# Patient Record
Sex: Female | Born: 1970 | Race: White | Hispanic: No
Health system: Southern US, Community
[De-identification: ages and names within clinical notes are randomized; demographics above are authoritative.]

## PROBLEM LIST (undated history)

## (undated) DIAGNOSIS — I10 Essential (primary) hypertension: Secondary | ICD-10-CM

## (undated) DIAGNOSIS — J45909 Unspecified asthma, uncomplicated: Secondary | ICD-10-CM

## (undated) DIAGNOSIS — M1990 Unspecified osteoarthritis, unspecified site: Secondary | ICD-10-CM

## (undated) DIAGNOSIS — T4145XA Adverse effect of unspecified anesthetic, initial encounter: Secondary | ICD-10-CM

## (undated) DIAGNOSIS — T8859XA Other complications of anesthesia, initial encounter: Secondary | ICD-10-CM

## (undated) HISTORY — PX: CHOLECYSTECTOMY: SHX55

---

## 2006-02-20 HISTORY — PX: WRIST FRACTURE SURGERY: SHX121

## 2006-03-03 ENCOUNTER — Emergency Department: Payer: Self-pay | Admitting: Emergency Medicine

## 2006-03-05 ENCOUNTER — Ambulatory Visit: Payer: Self-pay | Admitting: Orthopaedic Surgery

## 2012-01-06 ENCOUNTER — Ambulatory Visit: Payer: Self-pay | Admitting: Internal Medicine

## 2012-01-09 ENCOUNTER — Ambulatory Visit: Payer: Self-pay | Admitting: Family Medicine

## 2012-03-19 ENCOUNTER — Ambulatory Visit: Payer: Self-pay | Admitting: Orthopedic Surgery

## 2012-03-23 HISTORY — PX: KNEE ARTHROSCOPY: SUR90

## 2012-03-26 ENCOUNTER — Ambulatory Visit: Payer: Self-pay | Admitting: Orthopedic Surgery

## 2013-12-31 ENCOUNTER — Emergency Department: Payer: Self-pay | Admitting: Emergency Medicine

## 2014-01-13 ENCOUNTER — Emergency Department: Payer: Self-pay | Admitting: Emergency Medicine

## 2014-06-18 ENCOUNTER — Emergency Department: Payer: Self-pay | Admitting: Emergency Medicine

## 2015-03-03 ENCOUNTER — Ambulatory Visit: Payer: Self-pay | Admitting: Orthopedic Surgery

## 2015-04-16 NOTE — Op Note (Signed)
PATIENT NAME:  Sharon Tucker, Sharon Tucker MR#:  [id number] DATE OF BIRTH:  1971-11-14  DATE OF PROCEDURE:  03/26/2012  PREOPERATIVE DIAGNOSIS: Right knee internal derangement and osteoarthritis.   POSTOPERATIVE DIAGNOSES: Right knee osteoarthritis with anterolateral and posteromedial and anteromedial meniscus tears.   PROCEDURE: Right knee partial medial and lateral meniscectomy.   SURGEON: Laurene Footman, MD  ANESTHESIA: General.    DESCRIPTION OF PROCEDURE: Patient brought to the Operating Room and after adequate anesthesia was obtained, the right leg was prepped and draped in the usual sterile fashion with an arthroscopic legholder and tourniquet applied. After patient identification and timeout procedures were completed, an inferolateral portal was made and the arthroscope was introduced. Initial inspection revealed significant patellofemoral degenerative change but normal tracking. There was significant articular cartilage loss in the central portion of the patella and areas of nearly complete loss in the trochlea. Coming around medially, an inferomedial portal was made and on probing there were some areas of fissuring of the articular cartilage on both femoral and tibial condyles. On probing there was a small tear at the junction of the posterior and middle thirds of the medial meniscus and this was debrided with the use of a meniscal punch and ArthroCare wand getting back to a stable margin. Anteriorly there was also a tear of the anterior horn, approximately 3 to 4 mm in thickness from the inner edge and about 1 cm in length. This was ablated with the use of the ArthroCare wand as well. The anterior cruciate ligament was intact and the notch appeared normal. Going laterally there was much less degenerative change present, however, on probing there was an anterior horn tear that extended towards the notch and this was 2 to 3 mm in thickness and about 1 to 1.5 cm in length involving most of the anterior  horn. The ArthroCare wand was used to ablate this as well. Following addressing the meniscal pathology the gutters were checked. There were no loose bodies with no other treatable problems present. The knee was thoroughly irrigated and instrumentation withdrawn. The wounds were closed with simple interrupted 4-0 nylon. 20 mL of 0.5% Sensorcaine with epinephrine was infiltrated into the portals. Xeroform, 4 x 4's, Webril, and Ace wrap applied and the patient sent to recovery in stable condition.   ESTIMATED BLOOD LOSS: Minimal.   COMPLICATIONS: None.   SPECIMENS: None.  ____________________________ Laurene Footman, MD mjm:cms D: 03/26/2012 20:01:15 ET T: 03/27/2012 08:23:20 ET JOB#: [id number]  cc: Laurene Footman, MD, <Dictator> Laurene Footman MD ELECTRONICALLY SIGNED 03/27/2012 16:00

## 2015-06-13 ENCOUNTER — Encounter
Admission: RE | Admit: 2015-06-13 | Discharge: 2015-06-13 | Disposition: A | Discharge status: Home / Self Care | Payer: BLUE CROSS/BLUE SHIELD | Source: Ambulatory Visit | Attending: Orthopedic Surgery | Admitting: Orthopedic Surgery

## 2015-06-13 DIAGNOSIS — Z01810 Encounter for preprocedural cardiovascular examination: Secondary | ICD-10-CM | POA: Insufficient documentation

## 2015-06-13 DIAGNOSIS — Z87898 Personal history of other specified conditions: Secondary | ICD-10-CM | POA: Insufficient documentation

## 2015-06-13 DIAGNOSIS — I10 Essential (primary) hypertension: Secondary | ICD-10-CM | POA: Diagnosis not present

## 2015-06-13 DIAGNOSIS — Z01812 Encounter for preprocedural laboratory examination: Secondary | ICD-10-CM | POA: Diagnosis present

## 2015-06-13 DIAGNOSIS — Z9049 Acquired absence of other specified parts of digestive tract: Secondary | ICD-10-CM | POA: Insufficient documentation

## 2015-06-13 DIAGNOSIS — J45909 Unspecified asthma, uncomplicated: Secondary | ICD-10-CM | POA: Insufficient documentation

## 2015-06-13 HISTORY — DX: Adverse effect of unspecified anesthetic, initial encounter: T41.45XA

## 2015-06-13 HISTORY — DX: Essential (primary) hypertension: I10

## 2015-06-13 HISTORY — DX: Other complications of anesthesia, initial encounter: T88.59XA

## 2015-06-13 HISTORY — DX: Unspecified asthma, uncomplicated: J45.909

## 2015-06-13 LAB — CBC
HCT: 42.8 % (ref 35.0–47.0)
Hemoglobin: 14.1 g/dL (ref 12.0–16.0)
MCH: 30.8 pg (ref 26.0–34.0)
MCHC: 33 g/dL (ref 32.0–36.0)
MCV: 93.4 fL (ref 80.0–100.0)
PLATELETS: 234 10*3/uL (ref 150–440)
RBC: 4.58 MIL/uL (ref 3.80–5.20)
RDW: 13.4 % (ref 11.5–14.5)
WBC: 7.8 10*3/uL (ref 3.6–11.0)

## 2015-06-13 LAB — URINALYSIS COMPLETE WITH MICROSCOPIC (ARMC ONLY)
Bilirubin Urine: NEGATIVE
Glucose, UA: NEGATIVE mg/dL
KETONES UR: NEGATIVE mg/dL
Leukocytes, UA: NEGATIVE
Nitrite: NEGATIVE
PH: 5 (ref 5.0–8.0)
PROTEIN: NEGATIVE mg/dL
SPECIFIC GRAVITY, URINE: 1.025 (ref 1.005–1.030)

## 2015-06-13 LAB — BASIC METABOLIC PANEL
ANION GAP: 5 (ref 5–15)
BUN: 15 mg/dL (ref 6–20)
CHLORIDE: 105 mmol/L (ref 101–111)
CO2: 31 mmol/L (ref 22–32)
CREATININE: 0.65 mg/dL (ref 0.44–1.00)
Calcium: 9.3 mg/dL (ref 8.9–10.3)
GFR calc Af Amer: >60 mL/min (ref >60)
GFR calc non Af Amer: >60 mL/min (ref >60)
Glucose, Bld: 95 mg/dL (ref 65–99)
Potassium: 3.4 mmol/L — ABNORMAL LOW (ref 3.5–5.1)
Sodium: 141 mmol/L (ref 135–145)

## 2015-06-13 LAB — PROTIME-INR
INR: 0.94
Prothrombin Time: 12.8 seconds (ref 11.4–15.0)

## 2015-06-13 LAB — APTT: aPTT: 28 seconds (ref 24–36)

## 2015-06-13 NOTE — Patient Instructions (Addendum)
  Your procedure is scheduled on: Thursday June 22, 2015. Report to Same Day Surgery. To find out your arrival time please call [phone] between 1PM - 3PM on Wednesday June 21, 2015.  Remember: Instructions that are not followed completely may result in serious medical risk, up to and including death, or upon the discretion of your surgeon and anesthesiologist your surgery may need to be rescheduled.    _x___ 1. Do not eat food or drink liquids after midnight. No gum chewing or hard candies.     ____ 2. No Alcohol for 24 hours before or after surgery.   ____ 3. Bring all medications with you on the day of surgery if instructed.    _x__ 4. Notify your doctor if there is any change in your medical condition     (cold, fever, infections).     Do not wear jewelry, make-up, hairpins, clips or nail polish.  Do not wear lotions, powders, or perfumes. You may wear deodorant.  Do not shave 48 hours prior to surgery. Men may shave face and neck.  Do not bring valuables to the hospital.    [facility] is not responsible for any belongings or valuables.               Contacts, dentures or bridgework may not be worn into surgery.  Leave your suitcase in the car. After surgery it may be brought to your room.  For patients admitted to the hospital, discharge time is determined by your treatment team.   Patients discharged the day of surgery will not be allowed to drive home.    Please read over the following fact sheets that you were given:   [facility] Preparing for Surgery  ____ Take these medicines the morning of surgery with A SIP OF WATER:    1. Inhaler and bring to hospital  ____ Fleet Enema (as directed)   __x__ Use CHG Soap as directed  __x__ Use inhalers on the day of surgery  ____ Stop metformin 2 days prior to surgery    ____ Take 1/2 of usual insulin dose the night before surgery and none on the morning of surgery.   ____ Stop Coumadin/Plavix/aspirin on Does not  Apply.  _x___ Stop Anti-inflammatories on today.   ____ Stop supplements until after surgery.    __x__ Bring C-Pap to the hospital.

## 2015-06-22 ENCOUNTER — Encounter
Admission: RE | Disposition: A | Discharge status: Home / Self Care | Payer: Self-pay | Source: Ambulatory Visit | Attending: Orthopedic Surgery

## 2015-06-22 ENCOUNTER — Ambulatory Visit: Payer: BLUE CROSS/BLUE SHIELD | Admitting: Anesthesiology

## 2015-06-22 ENCOUNTER — Ambulatory Visit
Admission: RE | Admit: 2015-06-22 | Discharge: 2015-06-22 | Disposition: A | Discharge status: Home / Self Care | Payer: BLUE CROSS/BLUE SHIELD | Source: Ambulatory Visit | Attending: Orthopedic Surgery | Admitting: Orthopedic Surgery

## 2015-06-22 ENCOUNTER — Encounter: Payer: Self-pay | Admitting: *Deleted

## 2015-06-22 DIAGNOSIS — J45909 Unspecified asthma, uncomplicated: Secondary | ICD-10-CM | POA: Diagnosis not present

## 2015-06-22 DIAGNOSIS — Z79899 Other long term (current) drug therapy: Secondary | ICD-10-CM | POA: Insufficient documentation

## 2015-06-22 DIAGNOSIS — S83242A Other tear of medial meniscus, current injury, left knee, initial encounter: Secondary | ICD-10-CM | POA: Diagnosis present

## 2015-06-22 DIAGNOSIS — Z9049 Acquired absence of other specified parts of digestive tract: Secondary | ICD-10-CM | POA: Insufficient documentation

## 2015-06-22 DIAGNOSIS — I10 Essential (primary) hypertension: Secondary | ICD-10-CM | POA: Insufficient documentation

## 2015-06-22 DIAGNOSIS — M23222 Derangement of posterior horn of medial meniscus due to old tear or injury, left knee: Secondary | ICD-10-CM | POA: Diagnosis not present

## 2015-06-22 DIAGNOSIS — Z9102 Food additives allergy status: Secondary | ICD-10-CM | POA: Diagnosis not present

## 2015-06-22 DIAGNOSIS — Z87891 Personal history of nicotine dependence: Secondary | ICD-10-CM | POA: Insufficient documentation

## 2015-06-22 DIAGNOSIS — Z7951 Long term (current) use of inhaled steroids: Secondary | ICD-10-CM | POA: Insufficient documentation

## 2015-06-22 DIAGNOSIS — Z886 Allergy status to analgesic agent status: Secondary | ICD-10-CM | POA: Diagnosis not present

## 2015-06-22 DIAGNOSIS — M94262 Chondromalacia, left knee: Secondary | ICD-10-CM | POA: Diagnosis not present

## 2015-06-22 HISTORY — PX: KNEE ARTHROSCOPY: SHX127

## 2015-06-22 LAB — POCT PREGNANCY, URINE: Preg Test, Ur: NEGATIVE

## 2015-06-22 SURGERY — ARTHROSCOPY, KNEE
Anesthesia: General | Laterality: Left

## 2015-06-22 MED ORDER — PROMETHAZINE HCL 12.5 MG PO TABS
12.5000 mg | ORAL_TABLET | ORAL | Status: DC | PRN
Start: 2015-06-22 — End: 2017-07-15

## 2015-06-22 MED ORDER — LACTATED RINGERS IV SOLN
INTRAVENOUS | Status: DC
  Administered 2015-06-22 (×2): via INTRAVENOUS

## 2015-06-22 MED ORDER — EPHEDRINE SULFATE 50 MG/ML IJ SOLN
INTRAMUSCULAR | Status: DC | PRN
  Administered 2015-06-22: 5 mg via INTRAVENOUS

## 2015-06-22 MED ORDER — GLYCOPYRROLATE 0.2 MG/ML IJ SOLN
INTRAMUSCULAR | Status: DC | PRN
  Administered 2015-06-22: 0.2 mg via INTRAVENOUS

## 2015-06-22 MED ORDER — PROPOFOL 10 MG/ML IV BOLUS
INTRAVENOUS | Status: DC | PRN
  Administered 2015-06-22: 150 mg via INTRAVENOUS

## 2015-06-22 MED ORDER — HYDROCODONE-ACETAMINOPHEN 5-325 MG PO TABS: 1.0000 | ORAL_TABLET | ORAL | Status: DC | PRN

## 2015-06-22 MED ORDER — FENTANYL CITRATE (PF) 100 MCG/2ML IJ SOLN
INTRAMUSCULAR | Status: AC
  Administered 2015-06-22: 25 ug via INTRAVENOUS
  Filled 2015-06-22: qty 2

## 2015-06-22 MED ORDER — FENTANYL CITRATE (PF) 100 MCG/2ML IJ SOLN
25.0000 ug | INTRAMUSCULAR | Status: AC | PRN
  Administered 2015-06-22 (×6): 25 ug via INTRAVENOUS

## 2015-06-22 MED ORDER — MIDAZOLAM HCL 2 MG/2ML IJ SOLN
INTRAMUSCULAR | Status: DC | PRN
  Administered 2015-06-22 (×2): 1 mg via INTRAVENOUS

## 2015-06-22 MED ORDER — FAMOTIDINE 20 MG PO TABS
ORAL_TABLET | ORAL | Status: AC
  Administered 2015-06-22: 20 mg via ORAL
  Filled 2015-06-22: qty 1

## 2015-06-22 MED ORDER — ONDANSETRON HCL 4 MG/2ML IJ SOLN
INTRAMUSCULAR | Status: DC | PRN
  Administered 2015-06-22: 4 mg via INTRAVENOUS

## 2015-06-22 MED ORDER — BUPIVACAINE-EPINEPHRINE (PF) 0.25% -1:200000 IJ SOLN
INTRAMUSCULAR | Status: AC
  Filled 2015-06-22: qty 30

## 2015-06-22 MED ORDER — LIDOCAINE HCL (PF) 1 % IJ SOLN
INTRAMUSCULAR | Status: AC
  Filled 2015-06-22: qty 30

## 2015-06-22 MED ORDER — LIDOCAINE HCL 1 % IJ SOLN
INTRAMUSCULAR | Status: DC | PRN
  Administered 2015-06-22: 2 mL

## 2015-06-22 MED ORDER — CEFAZOLIN SODIUM-DEXTROSE 2-3 GM-% IV SOLR
INTRAVENOUS | Status: AC
  Filled 2015-06-22: qty 50

## 2015-06-22 MED ORDER — LIDOCAINE HCL (CARDIAC) 20 MG/ML IV SOLN
INTRAVENOUS | Status: DC | PRN
  Administered 2015-06-22: 30 mg via INTRAVENOUS

## 2015-06-22 MED ORDER — BUPIVACAINE-EPINEPHRINE 0.25% -1:200000 IJ SOLN
INTRAMUSCULAR | Status: DC | PRN
  Administered 2015-06-22: 20 mL

## 2015-06-22 MED ORDER — FENTANYL CITRATE (PF) 100 MCG/2ML IJ SOLN
INTRAMUSCULAR | Status: DC | PRN
  Administered 2015-06-22 (×2): 50 ug via INTRAVENOUS

## 2015-06-22 MED ORDER — FAMOTIDINE 20 MG PO TABS
20.0000 mg | ORAL_TABLET | Freq: Once | ORAL | Status: AC
  Administered 2015-06-22: 20 mg via ORAL

## 2015-06-22 MED ORDER — ONDANSETRON HCL 4 MG/2ML IJ SOLN: 4.0000 mg | Freq: Once | INTRAMUSCULAR | Status: DC | PRN

## 2015-06-22 MED ORDER — CEFAZOLIN SODIUM-DEXTROSE 2-3 GM-% IV SOLR
2.0000 g | Freq: Once | INTRAVENOUS | Status: AC
  Administered 2015-06-22: 2 g via INTRAVENOUS

## 2015-06-22 SURGICAL SUPPLY — 34 items
BUR RADIUS 3.5 (BURR) ×3 IMPLANT
BUR RADIUS 4.0X18.5 (BURR) ×3 IMPLANT
CLOSURE WOUND 1/2 X4 (GAUZE/BANDAGES/DRESSINGS) ×1
COOLER POLAR GLACIER W/PUMP (MISCELLANEOUS) ×3 IMPLANT
DECANTER SPIKE VIAL GLASS SM (MISCELLANEOUS) ×6 IMPLANT
DRAPE IMP U-DRAPE 54X76 (DRAPES) ×3 IMPLANT
DURAPREP 26ML APPLICATOR (WOUND CARE) ×9 IMPLANT
GAUZE PETRO XEROFOAM 1X8 (MISCELLANEOUS) ×3 IMPLANT
GAUZE SPONGE 4X4 12PLY STRL (GAUZE/BANDAGES/DRESSINGS) ×3 IMPLANT
GLOVE BIOGEL PI IND STRL 9 (GLOVE) ×1 IMPLANT
GLOVE BIOGEL PI INDICATOR 9 (GLOVE) ×2
GLOVE SURG 9.0 ORTHO LTXF (GLOVE) ×3 IMPLANT
GOWN STRL REUS W/ TWL LRG LVL3 (GOWN DISPOSABLE) ×1 IMPLANT
GOWN STRL REUS W/TWL 2XL LVL3 (GOWN DISPOSABLE) ×3 IMPLANT
GOWN STRL REUS W/TWL LRG LVL3 (GOWN DISPOSABLE) ×2
IV LACTATED RINGER IRRG 3000ML (IV SOLUTION) ×12
IV LR IRRIG 3000ML ARTHROMATIC (IV SOLUTION) ×6 IMPLANT
KIT RM TURNOVER STRD PROC AR (KITS) ×3 IMPLANT
MANIFOLD NEPTUNE II (INSTRUMENTS) ×3 IMPLANT
PACK ARTHROSCOPY KNEE (MISCELLANEOUS) ×3 IMPLANT
PAD ABD DERMACEA PRESS 5X9 (GAUZE/BANDAGES/DRESSINGS) IMPLANT
PAD WRAPON POLAR KNEE (MISCELLANEOUS) ×1 IMPLANT
SET TUBE SUCT SHAVER OUTFL 24K (TUBING) ×3 IMPLANT
SET TUBE TIP INTRA-ARTICULAR (MISCELLANEOUS) ×3 IMPLANT
SOL PREP PVP 2OZ (MISCELLANEOUS) ×3
SOLUTION PREP PVP 2OZ (MISCELLANEOUS) ×1 IMPLANT
STRIP CLOSURE SKIN 1/2X4 (GAUZE/BANDAGES/DRESSINGS) ×2 IMPLANT
SUT ETHILON 4-0 (SUTURE) ×2
SUT ETHILON 4-0 FS2 18XMFL BLK (SUTURE) ×1
SUTURE ETHLN 4-0 FS2 18XMF BLK (SUTURE) ×1 IMPLANT
TUBING ARTHRO INFLOW-ONLY STRL (TUBING) ×3 IMPLANT
WAND HAND CNTRL MULTIVAC 50 (MISCELLANEOUS) IMPLANT
WAND HAND CNTRL MULTIVAC 90 (MISCELLANEOUS) ×3 IMPLANT
WRAPON POLAR PAD KNEE (MISCELLANEOUS) ×3

## 2015-06-22 NOTE — Transfer of Care (Signed)
Immediate Anesthesia Transfer of Care Note  Patient: Sharon Tucker  Procedure(s) Performed: Procedure(s): ARTHROSCOPY KNEE/MEDIAL MENISCECTOMY (Left)  Patient Location: PACU  Anesthesia Type:General  Level of Consciousness: awake, oriented and patient cooperative  Airway & Oxygen Therapy: Patient Spontanous Breathing and Patient connected to face mask oxygen  Post-op Assessment: Report given to RN, Post -op Vital signs reviewed and stable and Patient moving all extremities X 4  Post vital signs: Reviewed and stable  Last Vitals:  Filed Vitals:   06/22/15 0926  BP: 157/103  Pulse: 96  Temp: 36.2 C  Resp: 14    Complications: No apparent anesthesia complications

## 2015-06-22 NOTE — Op Note (Signed)
05/11/2015  2:42 PM  PATIENT:  Sharon Tucker  PRE-OPERATIVE DIAGNOSIS:  TEAR OF MEDIAL MENISCUS  POST-OPERATIVE DIAGNOSIS:  Same  PROCEDURE:  LEFT KNEE ARTHROSCOPY WITH  Partial MEDIAL MENISECTOMY, partial synovectomy and chondroplasty of the medial femoral condyle and patellofemoral joint  SURGEON:  Thornton Park, MD  ANESTHESIA:   General  PREOPERATIVE INDICATIONS:  Sharon Tucker  44 y.o. female with a diagnosis of TEAR OF LEFT MEDIAL MENISCUS who failed conservative management and elected for surgical management.    The risks benefits and alternatives were discussed with the patient preoperatively including the risks of infection, bleeding, nerve injury, knee stiffness, persistent pain, osteoarthritis and the need for further surgery. Medical  risks include DVT and pulmonary embolism, myocardial infarction, stroke, pneumonia, respiratory failure and death. The patient understood these risks and wished to proceed.   OPERATIVE FINDINGS: Torn medial meniscus involving the posterior horn, grade 3 lesion of medial femoral condyle proximally 5 x 5 mm, grade 2 chondral wear of the undersurface of patella with extensive grade 3 chondral wear of the femoral trochlea  OPERATIVE PROCEDURE: Patient was met in the preoperative area. The operative extremity was signed with the word yes and my initials according the hospital's correct site of surgery protocol.  The patient was brought to the operating room where they was placed supine on the operative table. General anesthesia was administered. The patient was prepped and draped in a sterile fashion.  A timeout was performed to verify the patient's name, date of birth, medical record number, correct site of surgery correct procedure to be performed. It was also used to verify the patient had had received antibiotics that all appropriate instruments, and radiographic studies were available in the room. Once all in attendance were in  agreement, the case began.  Proposed arthroscopy incisions were drawn out with a surgical marker. These were pre-injected with 1% lidocaine plain. An 11 blade was used to establish an inferior lateral and inferomedial portals. The inferomedial portal was created using a 18-gauge spinal needle under direct visualization.  A full diagnostic examination of the knee was performed including the suprapatellar pouch, patellofemoral joint, medial lateral compartments as well as the medial lateral gutters, the intercondylar notch in the posterior knee.  Patient had the meniscal tear treated with a 4-0 resector shaver blade and straight duckbill biter. The meniscus was debrided until a stable rim was achieved. A chondroplasty of the medial femoral condyle, trochlea and undersurface of patella was also performed using a 4-0 resector shaver blade. A partial synovectomy was also performed using a 4-0 resector shaver blade and 90 ArthroCare wand.  The knee was then copiously lavaged. All arthroscopic incisions removed. The 2 arthroscopy portals were closed with 4-0 nylon. Steri-Strips were applied along with a dry sterile and compressive dressing. The patient was brought to the PACU in stable condition. I scrubbed and present the entire case and all sharp and instrument counts were correct at the conclusion the case. I spoke to the patient's family postoperatively to let them know the case it done without complication and the patient was stable in the recovery room.  Timoteo Gaul, MD

## 2015-06-22 NOTE — Anesthesia Preprocedure Evaluation (Signed)
Anesthesia Evaluation  Patient identified by MRN, date of birth, ID band Patient awake    Reviewed: Allergy & Precautions, NPO status , Patient's Chart, lab work & pertinent test results, reviewed documented beta blocker date and time   History of Anesthesia Complications (+) history of anesthetic complications  Airway Mallampati: III  TM Distance: >3 FB     Dental  (+) Chipped   Pulmonary asthma , former smoker,          Cardiovascular hypertension,     Neuro/Psych    GI/Hepatic   Endo/Other    Renal/GU      Musculoskeletal   Abdominal   Peds  Hematology   Anesthesia Other Findings Obesity.  Reproductive/Obstetrics                             Anesthesia Physical Anesthesia Plan  ASA: II  Anesthesia Plan: General   Post-op Pain Management:    Induction: Intravenous  Airway Management Planned: LMA  Additional Equipment:   Intra-op Plan:   Post-operative Plan:   Informed Consent: I have reviewed the patients History and Physical, chart, labs and discussed the procedure including the risks, benefits and alternatives for the proposed anesthesia with the patient or authorized representative who has indicated his/her understanding and acceptance.     Plan Discussed with: CRNA  Anesthesia Plan Comments:         Anesthesia Quick Evaluation

## 2015-06-22 NOTE — Anesthesia Procedure Notes (Signed)
Procedure Name: LMA Insertion Date/Time: 06/22/2015 7:46 AM Performed by: Courtney Paris Pre-anesthesia Checklist: Patient identified, Emergency Drugs available, Suction available, Patient being monitored and Timeout performed Patient Re-evaluated:Patient Re-evaluated prior to inductionOxygen Delivery Method: Circle system utilized Preoxygenation: Pre-oxygenation with 100% oxygen Intubation Type: Combination inhalational/ intravenous induction Ventilation: Mask ventilation without difficulty LMA: LMA inserted LMA Size: 4.0 Grade View: Grade II Number of attempts: 1 Tube secured with: Tape Dental Injury: Teeth and Oropharynx as per pre-operative assessment

## 2015-06-22 NOTE — Anesthesia Postprocedure Evaluation (Signed)
  Anesthesia Post-op Note  Patient: Sharon Tucker  Procedure(s) Performed: Procedure(s): ARTHROSCOPY KNEE/MEDIAL MENISCECTOMY (Left)  Anesthesia type:General  Patient location: PACU  Post pain: Pain level controlled  Post assessment: Post-op Vital signs reviewed, Patient's Cardiovascular Status Stable, Respiratory Function Stable, Patent Airway and No signs of Nausea or vomiting  Post vital signs: Reviewed and stable  Last Vitals:  Filed Vitals:   06/22/15 1055  BP: 128/78  Pulse: 70  Temp:   Resp: 16    Level of consciousness: awake, alert  and patient cooperative  Complications: No apparent anesthesia complications

## 2015-06-22 NOTE — Discharge Instructions (Signed)
AMBULATORY SURGERY  °DISCHARGE INSTRUCTIONS ° ° °1) The drugs that you were given will stay in your system until tomorrow so for the next 24 hours you should not: ° °A) Drive an automobile °B) Make any legal decisions °C) Drink any alcoholic beverage ° ° °2) You may resume regular meals tomorrow.  Today it is better to start with liquids and gradually work up to solid foods. ° °You may eat anything you prefer, but it is better to start with liquids, then soup and crackers, and gradually work up to solid foods. ° ° °3) Please notify your doctor immediately if you have any unusual bleeding, trouble breathing, redness and pain at the surgery site, drainage, fever, or pain not relieved by medication. °Please call to schedule your post-operative visit. ° °

## 2015-06-22 NOTE — H&P (Signed)
PREOPERATIVE H&P  Chief Complaint: TEAR OF MEDIAL MENISCUS  HPI: Sharon Tucker is a 44 y.o. female who presents for preoperative history and physical with a diagnosis of TEAR OF LEFT MEDIAL MENISCUS. Symptoms include pain and mechanical symptoms which are significantly impairing activities of daily living. She has failed conservative management She has elected for surgical treatment.   Past Medical History  Diagnosis Date  . Complication of anesthesia     Hard time waking up from anesthesia  . Asthma   . Hypertension    Past Surgical History  Procedure Laterality Date  . Knee arthroscopy Right 03/2012    3 times  . Cholecystectomy    . Wrist fracture surgery Right 02/2006   History   Social History  . Marital Status: Single    Spouse Name: N/A  . Number of Children: N/A  . Years of Education: N/A   Social History Main Topics  . Smoking status: Former Smoker    Quit date: 06/13/1991  . Smokeless tobacco: Former Systems developer  . Alcohol Use: No  . Drug Use: No  . Sexual Activity: Not on file   Other Topics Concern  . None   Social History Narrative   History reviewed. No pertinent family history. Allergies  Allergen Reactions  . Almond Oil Hives  . Ibuprofen Hives   Prior to Admission medications   Medication Sig Start Date End Date Taking? Authorizing Provider  albuterol (PROVENTIL HFA;VENTOLIN HFA) 108 (90 BASE) MCG/ACT inhaler Inhale 2 puffs into the lungs every 6 (six) hours as needed for wheezing or shortness of breath.   Yes Historical Provider, MD  lisinopril-hydrochlorothiazide (PRINZIDE,ZESTORETIC) 10-12.5 MG per tablet Take 1 tablet by mouth daily.   Yes Historical Provider, MD  EPINEPHrine (EPIPEN IJ) Inject as directed.    Historical Provider, MD  methylPREDNISolone (MEDROL DOSEPAK) 4 MG TBPK tablet Take by mouth.    Historical Provider, MD     Positive ROS: All other systems have been reviewed and were otherwise negative with the exception of those  mentioned in the HPI and as above.  Physical Exam: General: Alert, no acute distress Cardiovascular: Regular rate and rhythm, no murmurs rubs or gallops.  No pedal edema Respiratory: Clear to auscultation bilaterally, no wheezes rales or rhonchi. No cyanosis, no use of accessory musculature GI: No organomegaly, abdomen is soft and non-tender nondistended with positive bowel sounds. Skin: Skin intact, no lesions within the operative field. Neurologic: Sensation intact distally Psychiatric: Patient is competent for consent with normal mood and affect Lymphatic: No axillary or cervical lymphadenopathy  MUSCULOSKELETAL: Left knee: Pain at the medial and lateral joint lines. Positive McMurray's test. Range of motion from 0-120. No ligamentous laxity. +5 strength in all muscle groups. Intact sensation to light touch throughout the left lower extremity. Poppell pedal pulses.  Assessment: TEAR OF LEFT KNEE MEDIAL MENISCUS  Plan: Plan for Procedure(s): LEFT KNEE ARTHROSCOPY KNEE/MEDIAL MENISCECTOMY  I discussed in detail the procedure with the patient as well as the postoperative course. I answered all her questions.  I discussed the risks and benefits of surgery. The risks include but are not limited to infection, bleeding requiring blood transfusion, nerve or blood vessel injury, joint stiffness or loss of motion, persistent pain, weakness or instability, malunion, nonunion and hardware failure and the need for further surgery. Medical risks include but are not limited to DVT and pulmonary embolism, myocardial infarction, stroke, pneumonia, respiratory failure and death. Patient understood these risks and wished to proceed.   Mack Guise,  Lennette Bihari, MD   06/22/2015 7:31 AM

## 2015-09-20 ENCOUNTER — Other Ambulatory Visit: Payer: Self-pay | Admitting: Orthopedic Surgery

## 2015-09-20 DIAGNOSIS — M25561 Pain in right knee: Secondary | ICD-10-CM

## 2015-09-29 ENCOUNTER — Ambulatory Visit
Admission: RE | Admit: 2015-09-29 | Discharge: 2015-09-29 | Disposition: A | Discharge status: Home / Self Care | Payer: BLUE CROSS/BLUE SHIELD | Source: Ambulatory Visit | Attending: Orthopedic Surgery | Admitting: Orthopedic Surgery

## 2015-09-29 DIAGNOSIS — M25561 Pain in right knee: Secondary | ICD-10-CM | POA: Diagnosis present

## 2015-09-29 DIAGNOSIS — M9420 Chondromalacia, unspecified site: Secondary | ICD-10-CM | POA: Diagnosis not present

## 2015-09-29 DIAGNOSIS — S83241A Other tear of medial meniscus, current injury, right knee, initial encounter: Secondary | ICD-10-CM | POA: Diagnosis not present

## 2015-09-29 DIAGNOSIS — X58XXXA Exposure to other specified factors, initial encounter: Secondary | ICD-10-CM | POA: Diagnosis not present

## 2015-09-29 DIAGNOSIS — M7121 Synovial cyst of popliteal space [Baker], right knee: Secondary | ICD-10-CM | POA: Insufficient documentation

## 2016-07-01 IMAGING — MR MRI OF THE LEFT KNEE WITHOUT CONTRAST
6 series · 40 of 40 positions shown · non-contrast
Comparison: None.

CLINICAL DATA: LEFT knee pain. Initial encounter. Fall January 2015. Posterior knee pain. Clicking and popping.

EXAM:
MRI OF THE LEFT KNEE WITHOUT CONTRAST
TECHNIQUE: Multiplanar, multisequence MR imaging of the knee was performed. No
intravenous contrast was administered.

[Series 3: PD fat-sat · axial · 3.0mm · 0.29mm/px · z∈[-63,+49]mm · 7 of 35 slices shown (1 of 3)]
[im 1/35]
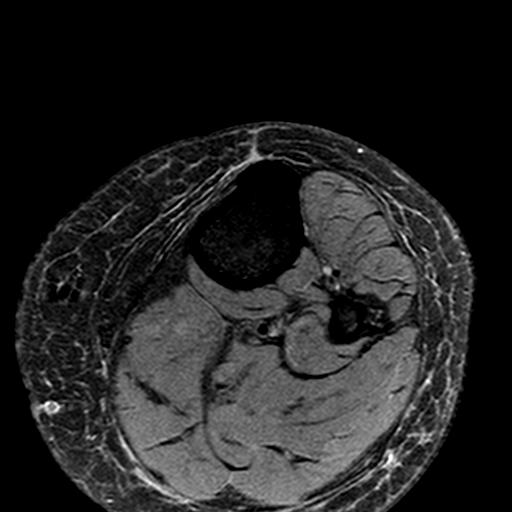
[im 6/35]
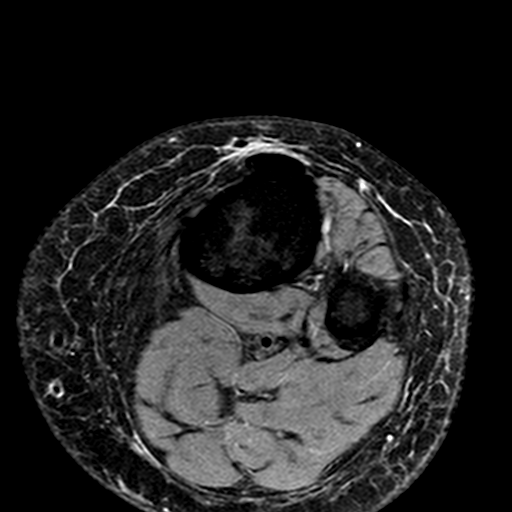
[im 12/35]
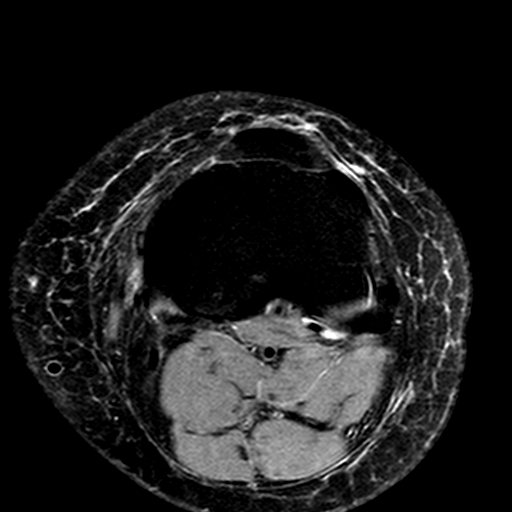
[im 18/35]
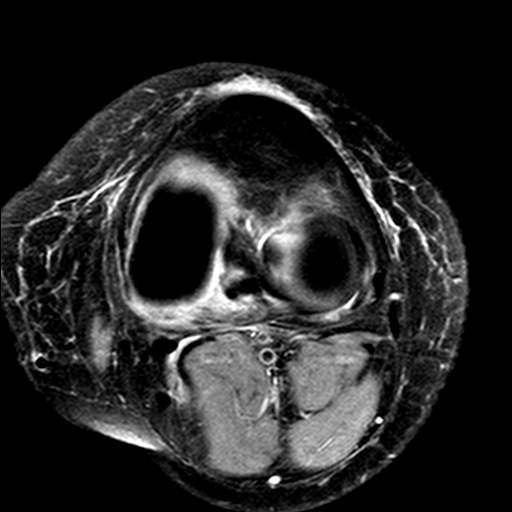
[im 23/35]
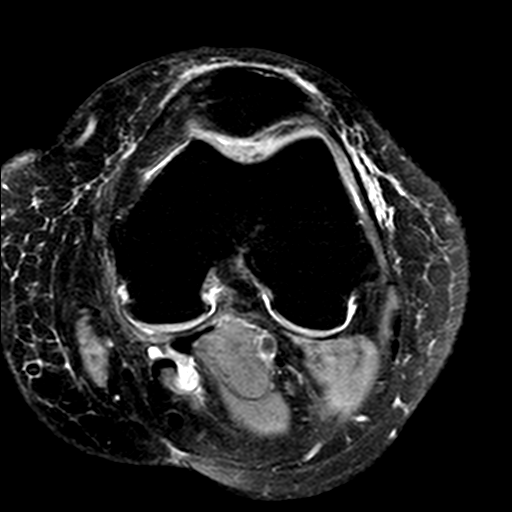
[im 29/35]
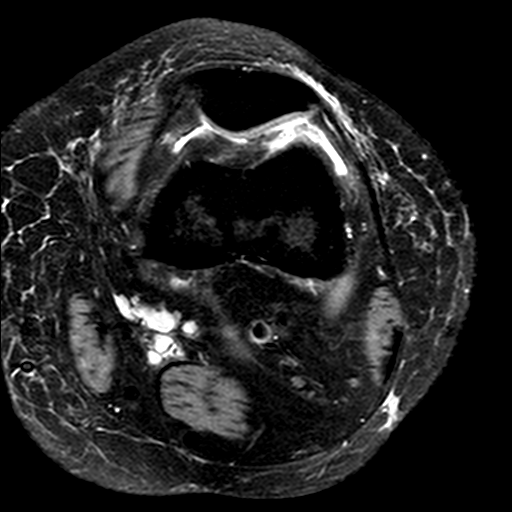
[im 35/35]
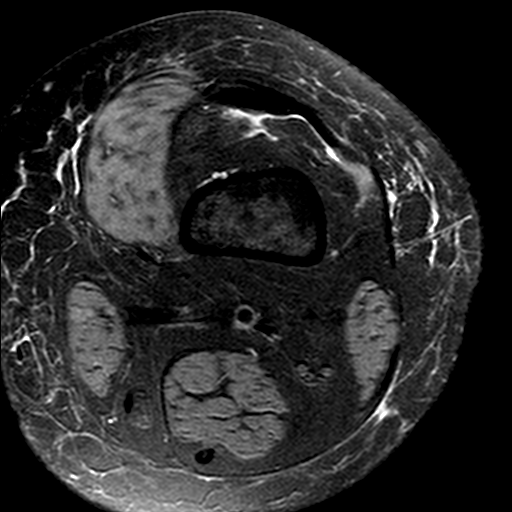

[Series 4: T2 fat-sat · coronal · 3.0mm · 0.62mm/px · 7 of 32 slices shown]
[im 1/32]
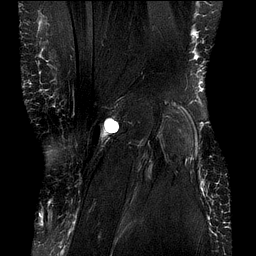
[im 6/32]
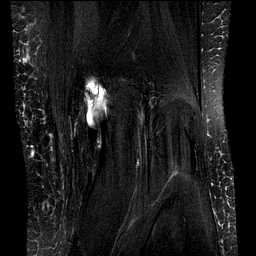
[im 11/32]
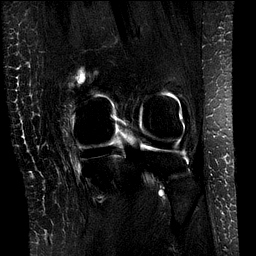
[im 16/32]
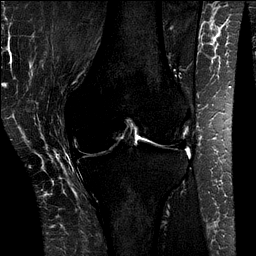
[im 21/32]
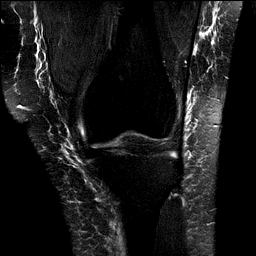
[im 26/32]
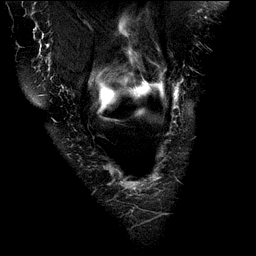
[im 32/32]
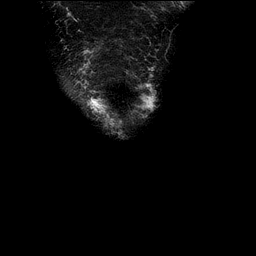

[Series 5: PD fat-sat · coronal · 3.0mm · 0.62mm/px · 8 of 35 slices shown (2 of 3)]
[im 1/35]
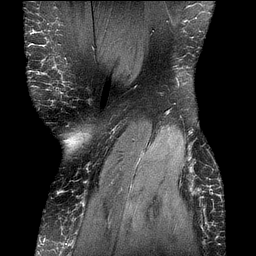
[im 5/35]
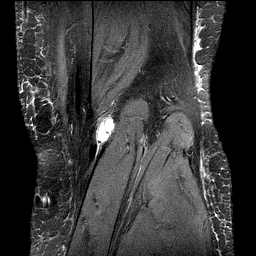
[im 10/35]
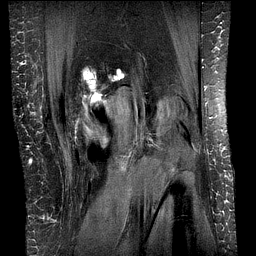
[im 15/35]
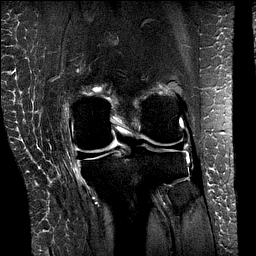
[im 20/35]
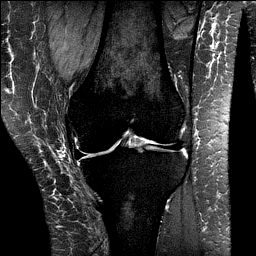
[im 25/35]
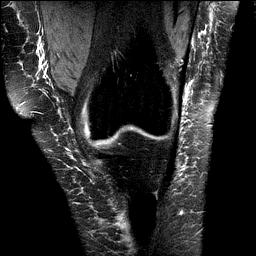
[im 30/35]
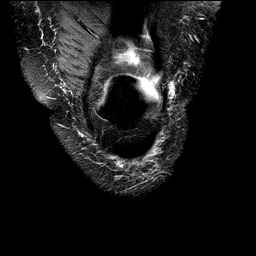
[im 35/35]
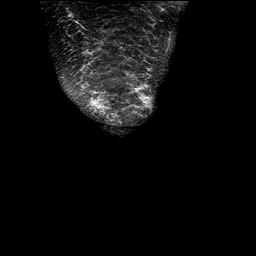

[Series 6: PD fat-sat · sagittal · 3.0mm · 0.62mm/px · 7 of 32 slices shown (3 of 3)]
[im 1/32]
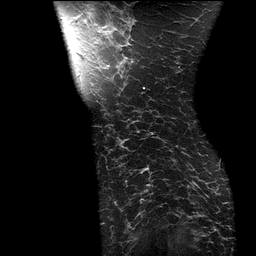
[im 6/32]
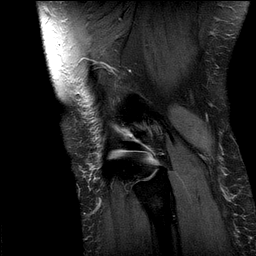
[im 11/32]
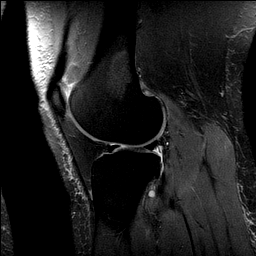
[im 16/32]
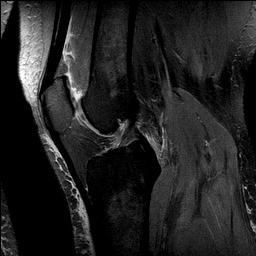
[im 21/32]
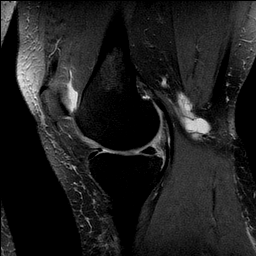
[im 26/32]
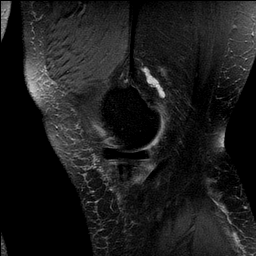
[im 32/32]
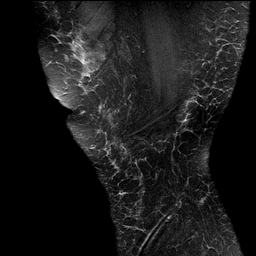

[Series 7: T1 · coronal · 3.0mm · 0.62mm/px · 8 of 35 slices shown]
[im 1/35]
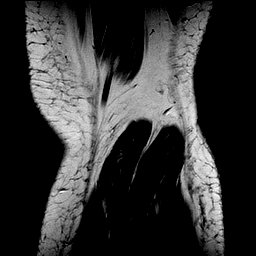
[im 5/35]
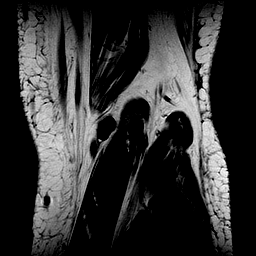
[im 10/35]
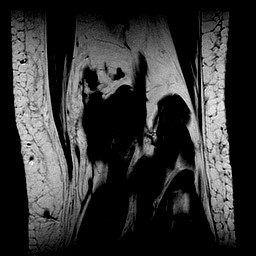
[im 15/35]
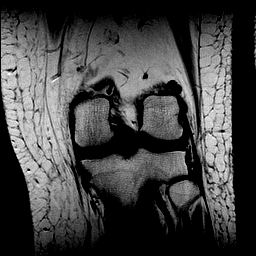
[im 20/35]
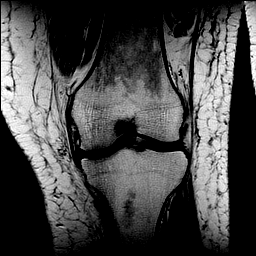
[im 25/35]
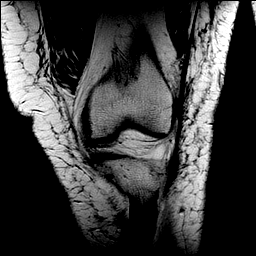
[im 30/35]
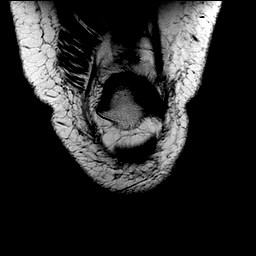
[im 35/35]
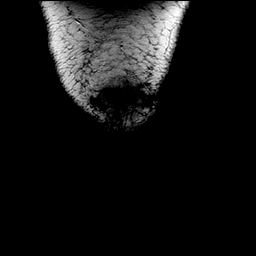

[Series 8: PD · oblique · 3.0mm · 0.31mm/px · 3 of 12 slices shown]
[im 1/12]
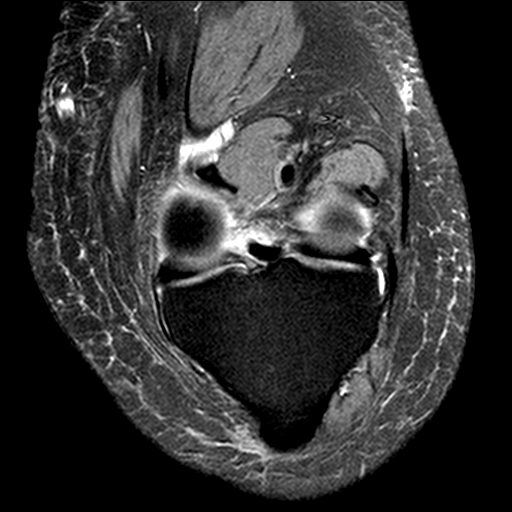
[im 6/12]
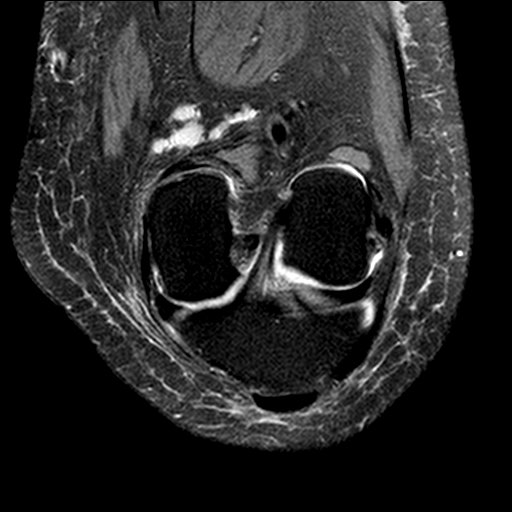
[im 12/12]
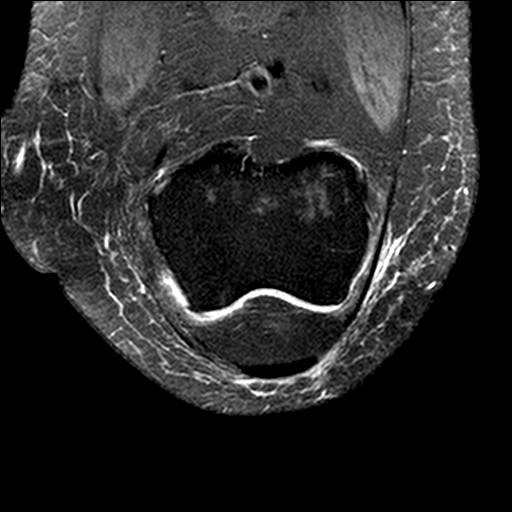

[40 of 40 positions shown; findings below may reference images not displayed]

FINDINGS: MENISCI

Medial meniscus: Severe degeneration of the medial meniscus
posterior horn root, without a discrete tear. Reactive edema is
present in the nearby tibial plateau. There is no extrusion.

Lateral meniscus:  Normal.

LIGAMENTS

Cruciates:  Normal.

Collaterals:  Normal.

CARTILAGE

Patellofemoral: Grade III chondromalacia in the apex and periapical
facets. Grade III chondromalacia in the inferior trochlear sulcus.

Medial:  No focal defects.  Normal.

Lateral:  Normal.

Joint:  Small effusion.

Popliteal Fossa: Septated/complex Baker cyst. Ganglion extends off
the cranial aspect of the Baker cyst superior to the popliteal
fossa.

Extensor Mechanism:  Intact.

Bones: Bone marrow signal shows suppression of normal fatty marrow.
This is a nonspecific finding most commonly associated with obesity,
anemia, cigarette smoking or chronic disease.
IMPRESSION: 1. Severe degeneration of the medial meniscus posterior horn root
without a tear.
2. Moderate patellofemoral osteoarthritis.
3. Small Baker's cyst with complex ganglion cyst arising from the
cranial aspect.

## 2016-09-15 ENCOUNTER — Encounter (HOSPITAL_COMMUNITY): Payer: Self-pay | Admitting: *Deleted

## 2016-09-15 ENCOUNTER — Emergency Department (HOSPITAL_COMMUNITY)
Admission: EM | Admit: 2016-09-15 | Discharge: 2016-09-15 | Disposition: A | Discharge status: Home / Self Care | Payer: BLUE CROSS/BLUE SHIELD | Attending: Emergency Medicine | Admitting: Emergency Medicine

## 2016-09-15 ENCOUNTER — Emergency Department (HOSPITAL_COMMUNITY): Payer: BLUE CROSS/BLUE SHIELD

## 2016-09-15 DIAGNOSIS — S86812A Strain of other muscle(s) and tendon(s) at lower leg level, left leg, initial encounter: Secondary | ICD-10-CM

## 2016-09-15 DIAGNOSIS — Y999 Unspecified external cause status: Secondary | ICD-10-CM | POA: Diagnosis not present

## 2016-09-15 DIAGNOSIS — Y929 Unspecified place or not applicable: Secondary | ICD-10-CM | POA: Diagnosis not present

## 2016-09-15 DIAGNOSIS — W109XXA Fall (on) (from) unspecified stairs and steps, initial encounter: Secondary | ICD-10-CM | POA: Insufficient documentation

## 2016-09-15 DIAGNOSIS — Y9301 Activity, walking, marching and hiking: Secondary | ICD-10-CM | POA: Diagnosis not present

## 2016-09-15 DIAGNOSIS — I10 Essential (primary) hypertension: Secondary | ICD-10-CM | POA: Diagnosis not present

## 2016-09-15 DIAGNOSIS — Z79899 Other long term (current) drug therapy: Secondary | ICD-10-CM | POA: Insufficient documentation

## 2016-09-15 DIAGNOSIS — S8992XA Unspecified injury of left lower leg, initial encounter: Secondary | ICD-10-CM | POA: Diagnosis present

## 2016-09-15 DIAGNOSIS — J45909 Unspecified asthma, uncomplicated: Secondary | ICD-10-CM | POA: Diagnosis not present

## 2016-09-15 DIAGNOSIS — S76112A Strain of left quadriceps muscle, fascia and tendon, initial encounter: Secondary | ICD-10-CM | POA: Insufficient documentation

## 2016-09-15 DIAGNOSIS — Z87891 Personal history of nicotine dependence: Secondary | ICD-10-CM | POA: Insufficient documentation

## 2016-09-15 NOTE — ED Notes (Signed)
Transported to xray 

## 2016-09-15 NOTE — ED Triage Notes (Signed)
Pt reports falling this am and having left knee pain. Hx of knee problems. Ambulatory at triage.

## 2016-09-15 NOTE — Discharge Instructions (Signed)
Ice and elevate. Aleve for inflammation. Tylenol for additional pain relief. Stay off of it tonight. Follow up with orthopedics if not improving.

## 2016-09-15 NOTE — ED Provider Notes (Signed)
[facility] DEPT Provider Note   CSN: [id number] Arrival date & time: 09/15/16  [id number]  By signing my name below, I, Sharon Tucker, attest that this documentation has been prepared under the direction and in the presence of Sharon Baggerly, PA-C.  Electronically Signed: Julien Tucker, ED Scribe. 09/15/16. 11:09 AM.    History   Chief Complaint Chief Complaint  Patient presents with  . Knee Pain    The history is provided by the patient. No language interpreter was used.   HPI Comments: Sharon Tucker is a 45 y.o. female who has a PMHx of HTN presents to the Emergency Department complaining of sudden onset, gradual worsening, moderate left knee pain x 1 day. Pt says that she was walking down some steep steps this morning when she stepped wrong and felt immediate discomfort in her left knee. She expresses increased pain with bending her knee. Pt has a pshx of arthroscopic surgery in the same knee one year ago. She denies any injury or trauma to the area.   Past Medical History:  Diagnosis Date  . Asthma   . Complication of anesthesia    Hard time waking up from anesthesia  . Hypertension     There are no active problems to display for this patient.   Past Surgical History:  Procedure Laterality Date  . CHOLECYSTECTOMY    . KNEE ARTHROSCOPY Right 03/2012   3 times  . KNEE ARTHROSCOPY Left 06/22/2015   Procedure: ARTHROSCOPY KNEE/MEDIAL MENISCECTOMY;  Surgeon: Thornton Park, MD;  Location: [facility] ORS;  Service: Orthopedics;  Laterality: Left;  . WRIST FRACTURE SURGERY Right 02/2006    OB History    No data available       Home Medications    Prior to Admission medications   Medication Sig Start Date End Date Taking? Authorizing Provider  albuterol (PROVENTIL HFA;VENTOLIN HFA) 108 (90 BASE) MCG/ACT inhaler Inhale 2 puffs into the lungs every 6 (six) hours as needed for wheezing or shortness of breath.    Historical Provider, MD  EPINEPHrine (EPIPEN IJ) Inject  as directed.    Historical Provider, MD  HYDROcodone-acetaminophen (NORCO) 5-325 MG per tablet Take 1-2 tablets by mouth every 4 (four) hours as needed for moderate pain. MAXIMUM TOTAL ACETAMINOPHEN DOSE IS 4000 MG PER DAY 06/22/15   Thornton Park, MD  lisinopril-hydrochlorothiazide (PRINZIDE,ZESTORETIC) 10-12.5 MG per tablet Take 1 tablet by mouth daily.    Historical Provider, MD  promethazine (PHENERGAN) 12.5 MG tablet Take 1 tablet (12.5 mg total) by mouth every 4 (four) hours as needed for nausea or vomiting. 06/22/15   Thornton Park, MD    Family History History reviewed. No pertinent family history.  Social History Social History  Substance Use Topics  . Smoking status: Former Smoker    Quit date: 06/13/1991  . Smokeless tobacco: Former Systems developer  . Alcohol use No     Allergies   Almond oil and Ibuprofen   Review of Systems Review of Systems  Constitutional: Negative for chills and fever.  Musculoskeletal: Positive for arthralgias.  Neurological: Negative for weakness and numbness.     Physical Exam Updated Vital Signs BP (!) 150/106 (BP Location: Left Arm)   Pulse 88   Temp 98.6 F (37 C) (Oral)   Resp 18   Ht 5\' 4"  (1.626 m)   Wt 249 lb (112.9 kg)   SpO2 98%   BMI 42.74 kg/m   Physical Exam  Constitutional: She appears well-developed and well-nourished. No distress.  Eyes: Conjunctivae are  normal.  Neck: Neck supple.  Musculoskeletal:  Normal-appearing left knee. No tenderness of the medial, lateral, posterior joint. Tenderness to palpation over patella tendon, anterior knee. Full range of motion of the knee. Full strength with extension and flexion against resistance at the knee joint. Patella tendon is intact. Normal ankle, normal dorsal pedal pulses.  Neurological: She is alert.  Skin: Skin is warm and dry.  Nursing note and vitals reviewed.    ED Treatments / Results  DIAGNOSTIC STUDIES: Oxygen Saturation is 98% on RA, normal by my  interpretation.  COORDINATION OF CARE:  11:07 AM Discussed treatment plan with pt at bedside and pt agreed to plan.  Labs (all labs ordered are listed, but only abnormal results are displayed) Labs Reviewed - No data to display  EKG  EKG Interpretation None       Radiology Dg Knee Complete 4 Views Left  Result Date: 09/15/2016 CLINICAL DATA:  Left knee pain after a fall this morning. EXAM: LEFT KNEE - COMPLETE 4+ VIEW COMPARISON:  Left knee MR dated 03/03/2015. FINDINGS: Moderate medial joint space narrowing. Mild medial, lateral and patellofemoral spur formation. No fracture, dislocation or effusion. IMPRESSION: No fracture.  Degenerative changes. Electronically Signed   By: Sharon Tucker M.D.   On: 09/15/2016 10:55    Procedures Procedures (including critical care time)  Medications Ordered in ED Medications - No data to display   Initial Impression / Assessment and Plan / ED Course  I have reviewed the triage vital signs and the nursing notes.  Pertinent labs & imaging results that were available during my care of the patient were reviewed by me and considered in my medical decision making (see chart for details).  Clinical Course    Patient with left knee pain after stepping wrong off a step. Pain is mainly over patella tendon. Patella tendon is intact. Strength and full range of motion of the knee normal on exam. Xray negative. Most likely patella tendon strain. Home with ice, elevation, knee sleeve which she has at home. Follow-up with orthopedics doctor if not improving. Ibuprofen or Tylenol for pain.  Vitals:   09/15/16 1003  BP: (!) 150/106  Pulse: 88  Resp: 18  Temp: 98.6 F (37 C)  TempSrc: Oral  SpO2: 98%  Weight: 112.9 kg  Height: 5\' 4"  (1.626 m)      Final Clinical Impressions(s) / ED Diagnoses   Final diagnoses:  Patellar tendon strain, left, initial encounter    New Prescriptions Discharge Medication List as of 09/15/2016 11:12 AM        Sharon Senior, PA-C 09/15/16 [place], MD 09/15/16 1349

## 2017-07-15 ENCOUNTER — Ambulatory Visit (INDEPENDENT_AMBULATORY_CARE_PROVIDER_SITE_OTHER): Payer: Worker's Compensation

## 2017-07-15 ENCOUNTER — Ambulatory Visit
Admission: EM | Admit: 2017-07-15 | Discharge: 2017-07-15 | Disposition: A | Discharge status: Home / Self Care | Payer: Worker's Compensation | Attending: Family Medicine | Admitting: Family Medicine

## 2017-07-15 DIAGNOSIS — W1841XA Slipping, tripping and stumbling without falling due to stepping on object, initial encounter: Secondary | ICD-10-CM

## 2017-07-15 DIAGNOSIS — S86911A Strain of unspecified muscle(s) and tendon(s) at lower leg level, right leg, initial encounter: Secondary | ICD-10-CM

## 2017-07-15 DIAGNOSIS — M25561 Pain in right knee: Secondary | ICD-10-CM | POA: Diagnosis not present

## 2017-07-15 MED ORDER — NAPROXEN 500 MG PO TABS: 500.0000 mg | ORAL_TABLET | Freq: Two times a day (BID) | ORAL | 0 refills | Status: DC

## 2017-07-15 NOTE — ED Triage Notes (Signed)
46 year old Caucasian woman is here today with complaints of right knee pain that she injured at her job on 07/03/17. She states she stepped on a box strapped and her right knee shot forward. She states she did not fall at all.

## 2017-07-15 NOTE — ED Provider Notes (Signed)
CSN: [id number]     Arrival date & time 07/15/17  1232 History   First MD Initiated Contact with Patient 07/15/17 1330     Chief Complaint  Patient presents with  . Knee Pain    Right   (Consider location/radiation/quality/duration/timing/severity/associated sxs/prior Treatment) HPI  So 46 year old female who presents with a 12 day history of an injury to her right knee. He states that she slipped on a strapping for a box where her right lower leg rushed forward while her body remains stationary. She did not fall. She states that she had anterior knee pain mostly lateral and inferior to the tibial tubercle. He states that over time and his continued to bother her. She's had intermittent swelling.        Past Medical History:  Diagnosis Date  . Asthma   . Complication of anesthesia    Hard time waking up from anesthesia  . Hypertension    Past Surgical History:  Procedure Laterality Date  . CHOLECYSTECTOMY    . KNEE ARTHROSCOPY Right 03/2012   3 times  . KNEE ARTHROSCOPY Left 06/22/2015   Procedure: ARTHROSCOPY KNEE/MEDIAL MENISCECTOMY;  Surgeon: Thornton Park, MD;  Location: [facility] ORS;  Service: Orthopedics;  Laterality: Left;  . WRIST FRACTURE SURGERY Right 02/2006   Family History  Problem Relation Age of Onset  . Cancer Father    Social History  Substance Use Topics  . Smoking status: Former Smoker    Quit date: 06/13/1991  . Smokeless tobacco: Former Systems developer  . Alcohol use No   OB History    No data available     Review of Systems  Constitutional: Positive for activity change. Negative for appetite change, chills, fatigue and fever.  Musculoskeletal: Positive for myalgias.  All other systems reviewed and are negative.   Allergies  Almond oil and Ibuprofen  Home Medications   Prior to Admission medications   Medication Sig Start Date End Date Taking? Authorizing Provider  albuterol (PROVENTIL HFA;VENTOLIN HFA) 108 (90 BASE) MCG/ACT inhaler Inhale 2 puffs  into the lungs every 6 (six) hours as needed for wheezing or shortness of breath.   Yes [provider]  EPINEPHrine (EPIPEN IJ) Inject as directed.   Yes [provider]  lisinopril-hydrochlorothiazide (PRINZIDE,ZESTORETIC) 10-12.5 MG per tablet Take 1 tablet by mouth daily.   Yes [provider]  naproxen (NAPROSYN) 500 MG tablet Take 1 tablet (500 mg total) by mouth 2 (two) times daily. 07/15/17   Lorin Picket, PA-C   Meds Ordered and Administered this Visit  Medications - No data to display  BP (!) 160/104 (BP Location: Right Arm)   Pulse 76   Temp 98.4 F (36.9 C) (Oral)   Resp 18   Ht 5\' 4"  (1.626 m)   Wt 245 lb (111.1 kg)   LMP 02/19/2015 Comment: denies preg  SpO2 98%   BMI 42.05 kg/m  No data found.   Physical Exam  Constitutional: She is oriented to person, place, and time. She appears well-developed and well-nourished. No distress.  HENT:  Head: Normocephalic.  Eyes: Pupils are equal, round, and reactive to light.  Neck: Normal range of motion.  Musculoskeletal: Normal range of motion. She exhibits tenderness. She exhibits no edema or deformity.  Examination of the right knee shows no effusion. There is no swelling or edema erythema or ecchymosis present. No PF tenderness. Good range of motion of the knee. Collateral ligaments are all intact. There is no joint line tenderness present. The cruciate  ligament is intact. Next the tenderness is along the tibial plateau and extending inferiorly and laterally from there. There is no crepitus present. Ambulation has a mild antalgic gait  Neurological: She is alert and oriented to person, place, and time.  Skin: Skin is warm and dry. She is not diaphoretic.  Psychiatric: She has a normal mood and affect. Her behavior is normal. Judgment and thought content normal.  Nursing note and vitals reviewed.   Urgent Care Course     Procedures (including critical care time)  Labs Review Labs Reviewed -  No data to display  Imaging Review Dg Knee Complete 4 Views Right  Result Date: 07/15/2017 CLINICAL DATA:  46 year old slipped while at work 2 weeks ago, injuring the right knee. Prior right knee surgery x 3 for cartilage removal. Initial encounter. EXAM: RIGHT KNEE - COMPLETE 4+ VIEW COMPARISON:  MRI right knee 09/29/2015. Right knee x-rays 12/31/2013, 01/06/2012. FINDINGS: No evidence of acute or subacute fracture or dislocation. Moderate to marked medial compartment joint space narrowing. Lateral and patellofemoral compartment joint spaces well-preserved. Bone mineral density well-preserved. No intrinsic osseous abnormality. No visible joint effusion. IMPRESSION: 1. No acute or subacute osseous abnormality. 2. Moderate to marked medial compartment joint space narrowing indicating unicompartmental osteoarthritis. Electronically Signed   By: Evangeline Dakin M.D.   On: 07/15/2017 14:09     Visual Acuity Review  Right Eye Distance:   Left Eye Distance:   Bilateral Distance:    Right Eye Near:   Left Eye Near:    Bilateral Near:     Patient was given a neoprene knee sleeve    MDM   1. Strain of right knee, initial encounter    Discharge Medication List as of 07/15/2017  2:29 PM    START taking these medications   Details  naproxen (NAPROSYN) 500 MG tablet Take 1 tablet (500 mg total) by mouth 2 (two) times daily., Starting Tue 07/15/2017, Normal      Plan: 1. Test/x-ray results and diagnosis reviewed with patient 2. rx as per orders; risks, benefits, potential side effects reviewed with patient 3. Recommend supportive treatment with decreased standing and ambulation on the leg as much as possible. I've given her restrictions for 2 weeks. I recommended the use of a cane in the opposite hand to help until the knee becomes less painful. I will prescribe Naprosyn. The patient tells me that she is able to take Naprosyn she is only allergic to the ibuprofen. She has taken Naprosyn in the  past without incident. If she is not improving she should follow-up with a orthopedic surgeon. 4. F/u prn if symptoms worsen or don't improve     Lorin Picket, PA-C 07/15/17 1502

## 2018-10-21 HISTORY — PX: ABDOMINAL HYSTERECTOMY: SHX81

## 2018-12-04 ENCOUNTER — Other Ambulatory Visit: Payer: Self-pay

## 2018-12-04 ENCOUNTER — Encounter: Payer: Self-pay | Admitting: Family Medicine

## 2018-12-04 DIAGNOSIS — Z1211 Encounter for screening for malignant neoplasm of colon: Secondary | ICD-10-CM

## 2018-12-04 MED ORDER — NA SULFATE-K SULFATE-MG SULF 17.5-3.13-1.6 GM/177ML PO SOLN: 1.0000 | Freq: Once | ORAL | 0 refills | Status: AC

## 2018-12-07 ENCOUNTER — Encounter: Payer: Self-pay | Admitting: *Deleted

## 2018-12-07 ENCOUNTER — Other Ambulatory Visit: Payer: Self-pay

## 2018-12-11 NOTE — Discharge Instructions (Signed)
General Anesthesia, Adult, Care After  This sheet gives you information about how to care for yourself after your procedure. Your health care provider may also give you more specific instructions. If you have problems or questions, contact your health care provider.  What can I expect after the procedure?  After the procedure, the following side effects are common:  Pain or discomfort at the IV site.  Nausea.  Vomiting.  Sore throat.  Trouble concentrating.  Feeling cold or chills.  Weak or tired.  Sleepiness and fatigue.  Soreness and body aches. These side effects can affect parts of the body that were not involved in surgery.  Follow these instructions at home:    For at least 24 hours after the procedure:  Have a responsible adult stay with you. It is important to have someone help care for you until you are awake and alert.  Rest as needed.  Do not:  Participate in activities in which you could fall or become injured.  Drive.  Use heavy machinery.  Drink alcohol.  Take sleeping pills or medicines that cause drowsiness.  Make important decisions or sign legal documents.  Take care of children on your own.  Eating and drinking  Follow any instructions from your health care provider about eating or drinking restrictions.  When you feel hungry, start by eating small amounts of foods that are soft and easy to digest (bland), such as toast. Gradually return to your regular diet.  Drink enough fluid to keep your urine pale yellow.  If you vomit, rehydrate by drinking water, juice, or clear broth.  General instructions  If you have sleep apnea, surgery and certain medicines can increase your risk for breathing problems. Follow instructions from your health care provider about wearing your sleep device:  Anytime you are sleeping, including during daytime naps.  While taking prescription pain medicines, sleeping medicines, or medicines that make you drowsy.  Return to your normal activities as told by your health care  provider. Ask your health care provider what activities are safe for you.  Take over-the-counter and prescription medicines only as told by your health care provider.  If you smoke, do not smoke without supervision.  Keep all follow-up visits as told by your health care provider. This is important.  Contact a health care provider if:  You have nausea or vomiting that does not get better with medicine.  You cannot eat or drink without vomiting.  You have pain that does not get better with medicine.  You are unable to pass urine.  You develop a skin rash.  You have a fever.  You have redness around your IV site that gets worse.  Get help right away if:  You have difficulty breathing.  You have chest pain.  You have blood in your urine or stool, or you vomit blood.  Summary  After the procedure, it is common to have a sore throat or nausea. It is also common to feel tired.  Have a responsible adult stay with you for the first 24 hours after general anesthesia. It is important to have someone help care for you until you are awake and alert.  When you feel hungry, start by eating small amounts of foods that are soft and easy to digest (bland), such as toast. Gradually return to your regular diet.  Drink enough fluid to keep your urine pale yellow.  Return to your normal activities as told by your health care provider. Ask your health care   provider what activities are safe for you.  This information is not intended to replace advice given to you by your health care provider. Make sure you discuss any questions you have with your health care provider.  Document Released: 03/17/2001 Document Revised: 07/25/2017 Document Reviewed: 07/25/2017  Elsevier Interactive Patient Education  2019 [employer]

## 2018-12-14 ENCOUNTER — Ambulatory Visit
Admission: RE | Admit: 2018-12-14 | Discharge: 2018-12-14 | Disposition: A | Discharge status: Home / Self Care | Payer: BLUE CROSS/BLUE SHIELD | Attending: Gastroenterology | Admitting: Gastroenterology

## 2018-12-14 ENCOUNTER — Encounter
Admission: RE | Disposition: A | Discharge status: Home / Self Care | Payer: Self-pay | Source: Home / Self Care | Attending: Gastroenterology

## 2018-12-14 ENCOUNTER — Ambulatory Visit: Payer: BLUE CROSS/BLUE SHIELD | Admitting: Anesthesiology

## 2018-12-14 DIAGNOSIS — D125 Benign neoplasm of sigmoid colon: Secondary | ICD-10-CM

## 2018-12-14 DIAGNOSIS — Z8542 Personal history of malignant neoplasm of other parts of uterus: Secondary | ICD-10-CM | POA: Diagnosis not present

## 2018-12-14 DIAGNOSIS — Z1211 Encounter for screening for malignant neoplasm of colon: Secondary | ICD-10-CM | POA: Diagnosis not present

## 2018-12-14 DIAGNOSIS — J45909 Unspecified asthma, uncomplicated: Secondary | ICD-10-CM | POA: Insufficient documentation

## 2018-12-14 DIAGNOSIS — Z87891 Personal history of nicotine dependence: Secondary | ICD-10-CM | POA: Insufficient documentation

## 2018-12-14 DIAGNOSIS — G4730 Sleep apnea, unspecified: Secondary | ICD-10-CM | POA: Insufficient documentation

## 2018-12-14 DIAGNOSIS — I10 Essential (primary) hypertension: Secondary | ICD-10-CM | POA: Insufficient documentation

## 2018-12-14 DIAGNOSIS — Z6841 Body Mass Index (BMI) 40.0 and over, adult: Secondary | ICD-10-CM | POA: Insufficient documentation

## 2018-12-14 DIAGNOSIS — K635 Polyp of colon: Secondary | ICD-10-CM

## 2018-12-14 HISTORY — PX: POLYPECTOMY: SHX5525

## 2018-12-14 HISTORY — PX: COLONOSCOPY WITH PROPOFOL: SHX5780

## 2018-12-14 HISTORY — DX: Unspecified osteoarthritis, unspecified site: M19.90

## 2018-12-14 SURGERY — COLONOSCOPY WITH PROPOFOL
Anesthesia: General | Site: Rectum

## 2018-12-14 MED ORDER — LACTATED RINGERS IV SOLN
INTRAVENOUS | Status: DC
  Administered 2018-12-14: 09:00:00 via INTRAVENOUS

## 2018-12-14 MED ORDER — PROPOFOL 10 MG/ML IV BOLUS
INTRAVENOUS | Status: DC | PRN
  Administered 2018-12-14: 20 mg via INTRAVENOUS
  Administered 2018-12-14: 10 mg via INTRAVENOUS
  Administered 2018-12-14: 20 mg via INTRAVENOUS
  Administered 2018-12-14: 10 mg via INTRAVENOUS
  Administered 2018-12-14: 30 mg via INTRAVENOUS
  Administered 2018-12-14: 20 mg via INTRAVENOUS
  Administered 2018-12-14: 70 mg via INTRAVENOUS
  Administered 2018-12-14: 20 mg via INTRAVENOUS

## 2018-12-14 MED ORDER — OXYCODONE HCL 5 MG PO TABS: 5.0000 mg | ORAL_TABLET | Freq: Once | ORAL | Status: DC | PRN

## 2018-12-14 MED ORDER — LIDOCAINE HCL (CARDIAC) PF 100 MG/5ML IV SOSY
PREFILLED_SYRINGE | INTRAVENOUS | Status: DC | PRN
  Administered 2018-12-14: 40 mg via INTRAVENOUS

## 2018-12-14 MED ORDER — STERILE WATER FOR IRRIGATION IR SOLN
Status: DC | PRN
  Administered 2018-12-14: 09:00:00

## 2018-12-14 MED ORDER — SODIUM CHLORIDE 0.9 % IV SOLN: INTRAVENOUS | Status: DC

## 2018-12-14 MED ORDER — OXYCODONE HCL 5 MG/5ML PO SOLN: 5.0000 mg | Freq: Once | ORAL | Status: DC | PRN

## 2018-12-14 SURGICAL SUPPLY — 6 items
CANISTER SUCT 1200ML W/VALVE (MISCELLANEOUS) ×4 IMPLANT
FORCEPS BIOP RAD 4 LRG CAP 4 (CUTTING FORCEPS) ×4 IMPLANT
GOWN CVR UNV OPN BCK APRN NK (MISCELLANEOUS) ×4 IMPLANT
GOWN ISOL THUMB LOOP REG UNIV (MISCELLANEOUS) ×4
KIT ENDO PROCEDURE OLY (KITS) ×4 IMPLANT
WATER STERILE IRR 250ML POUR (IV SOLUTION) ×4 IMPLANT

## 2018-12-14 NOTE — Anesthesia Procedure Notes (Signed)
Performed by: Kengo Sturges, CRNA Pre-anesthesia Checklist: Patient identified, Emergency Drugs available, Suction available, Timeout performed and Patient being monitored Patient Re-evaluated:Patient Re-evaluated prior to induction Oxygen Delivery Method: Nasal cannula Placement Confirmation: positive ETCO2       

## 2018-12-14 NOTE — H&P (Signed)
Lucilla Lame, MD [address] [place], [place] [address] Phone: [phone] Fax : [phone]  Primary Care Physician:  Lynnell Jude, MD Primary Gastroenterologist:  Dr. Allen Norris  Pre-Procedure History & Physical: HPI:  Sharon Tucker is a 47 y.o. female is here for a screening colonoscopy.   Past Medical History:  Diagnosis Date  . Arthritis    knees  . Asthma   . Complication of anesthesia    Hard time waking up from anesthesia  . Hypertension     Past Surgical History:  Procedure Laterality Date  . ABDOMINAL HYSTERECTOMY  10/21/2018   [facility]  . CHOLECYSTECTOMY    . KNEE ARTHROSCOPY Right 03/2012   3 times  . KNEE ARTHROSCOPY Left 06/22/2015   Procedure: ARTHROSCOPY KNEE/MEDIAL MENISCECTOMY;  Surgeon: Thornton Park, MD;  Location: [facility] ORS;  Service: Orthopedics;  Laterality: Left;  . WRIST FRACTURE SURGERY Right 02/2006    Prior to Admission medications   Medication Sig Start Date End Date Taking? Authorizing Provider  albuterol (PROVENTIL HFA;VENTOLIN HFA) 108 (90 BASE) MCG/ACT inhaler Inhale 2 puffs into the lungs every 6 (six) hours as needed for wheezing or shortness of breath.   Yes [provider]  EPINEPHrine (EPIPEN IJ) Inject as directed.   Yes [provider]  hydrochlorothiazide (HYDRODIURIL) 25 MG tablet Take 25 mg by mouth daily.   Yes [provider]  lisinopril (PRINIVIL,ZESTRIL) 10 MG tablet Take 10 mg by mouth daily.   Yes [provider]    Allergies as of 12/04/2018 - Review Complete 07/15/2017  Allergen Reaction Noted  . Almond oil Hives 06/13/2015  . Ibuprofen Hives 06/13/2015    Family History  Problem Relation Age of Onset  . Cancer Father     Social History   Socioeconomic History  . Marital status: Single    Spouse name: Not on file  . Number of children: Not on file  . Years of education: Not on file  . Highest education level: Not on file  Occupational History  . Not on  file  Social Needs  . Financial resource strain: Not on file  . Food insecurity:    Worry: Not on file    Inability: Not on file  . Transportation needs:    Medical: Not on file    Non-medical: Not on file  Tobacco Use  . Smoking status: Former Smoker    Last attempt to quit: 06/13/1991    Years since quitting: 27.5  . Smokeless tobacco: Former Network engineer and Sexual Activity  . Alcohol use: No  . Drug use: No  . Sexual activity: Yes  Lifestyle  . Physical activity:    Days per week: Not on file    Minutes per session: Not on file  . Stress: Not on file  Relationships  . Social connections:    Talks on phone: Not on file    Gets together: Not on file    Attends religious service: Not on file    Active member of club or organization: Not on file    Attends meetings of clubs or organizations: Not on file    Relationship status: Not on file  . Intimate partner violence:    Fear of current or ex partner: Not on file    Emotionally abused: Not on file    Physically abused: Not on file    Forced sexual activity: Not on file  Other Topics Concern  . Not on file  Social  History Narrative  . Not on file    Review of Systems: See HPI, otherwise negative ROS  Physical Exam: BP 122/80   Pulse 79   Temp 97.6 F (36.4 C) (Temporal)   Ht 5\' 4"  (1.626 m)   Wt 112.5 kg   LMP 12/24/2011 (Approximate) Comment: TAH  SpO2 97%   BMI 42.57 kg/m  General:   Alert,  pleasant and cooperative in NAD Head:  Normocephalic and atraumatic. Neck:  Supple; no masses or thyromegaly. Lungs:  Clear throughout to auscultation.    Heart:  Regular rate and rhythm. Abdomen:  Soft, nontender and nondistended. Normal bowel sounds, without guarding, and without rebound.   Neurologic:  Alert and  oriented x4;  grossly normal neurologically.  Impression/Plan: Sharon Tucker is now here to undergo a screening colonoscopy.  Risks, benefits, and alternatives regarding colonoscopy have been  reviewed with the patient.  Questions have been answered.  All parties agreeable.

## 2018-12-14 NOTE — Op Note (Signed)
[facility] Gastroenterology Patient Name: Sharon Tucker Procedure Date: 12/14/2018 8:55 AM MRN: [id number] Account #: [id number] Date of Birth: [birth year] Admit Type: Outpatient Age: 47 Room: [facility] OR ROOM 01 Gender: Female Note Status: Finalized Procedure:            Colonoscopy Indications:          Screening for colorectal malignant neoplasm Providers:            Lucilla Lame MD, MD Referring MD:         Lynnell Jude (Referring MD) Medicines:            Propofol per Anesthesia Complications:        No immediate complications. Procedure:            Pre-Anesthesia Assessment:                       - Prior to the procedure, a History and Physical was                        performed, and patient medications and allergies were                        reviewed. The patient's tolerance of previous                        anesthesia was also reviewed. The risks and benefits of                        the procedure and the sedation options and risks were                        discussed with the patient. All questions were                        answered, and informed consent was obtained. Prior                        Anticoagulants: The patient has taken no previous                        anticoagulant or antiplatelet agents. ASA Grade                        Assessment: II - A patient with mild systemic disease.                        After reviewing the risks and benefits, the patient was                        deemed in satisfactory condition to undergo the                        procedure.                       After obtaining informed consent, the colonoscope was                        passed under direct vision. Throughout the procedure,  the patient's blood pressure, pulse, and oxygen                        saturations were monitored continuously. The was                        introduced through the anus and advanced to the the                 cecum, identified by appendiceal orifice and ileocecal                        valve. The colonoscopy was performed without                        difficulty. The patient tolerated the procedure well.                        The quality of the bowel preparation was excellent. Findings:      The perianal and digital rectal examinations were normal.      A 3 mm polyp was found in the sigmoid colon. The polyp was sessile. The       polyp was removed with a cold biopsy forceps. Resection and retrieval       were complete. Impression:           - One 3 mm polyp in the sigmoid colon, removed with a                        cold biopsy forceps. Resected and retrieved. Recommendation:       - Discharge patient to home.                       - Resume previous diet.                       - Continue present medications.                       - Await pathology results.                       - Repeat colonoscopy in 5 years if polyp adenoma and 10                        years if hyperplastic Procedure Code(s):    --- Professional ---                       [phone], Colonoscopy, flexible; with biopsy, single or                        multiple Diagnosis Code(s):    --- Professional ---                       Z12.11, Encounter for screening for malignant neoplasm                        of colon                       D12.5, Benign neoplasm of sigmoid colon CPT copyright 2018 American Medical [employer]. All rights  reserved. The codes documented in this report are preliminary and upon coder review may  be revised to meet current compliance requirements. Lucilla Lame MD, MD 12/14/2018 9:16:13 AM This report has been signed electronically. Number of Addenda: 0 Note Initiated On: 12/14/2018 8:55 AM Scope Withdrawal Time: 0 hours 8 minutes 4 seconds  Total Procedure Duration: 0 hours 10 minutes 4 seconds       [facility]

## 2018-12-14 NOTE — Transfer of Care (Signed)
Immediate Anesthesia Transfer of Care Note  Patient: Sharon Tucker  Procedure(s) Performed: COLONOSCOPY WITH PROPOFOL (N/A Rectum)  Patient Location: PACU  Anesthesia Type: General  Level of Consciousness: awake, alert  and patient cooperative  Airway and Oxygen Therapy: Patient Spontanous Breathing and Patient connected to supplemental oxygen  Post-op Assessment: Post-op Vital signs reviewed, Patient's Cardiovascular Status Stable, Respiratory Function Stable, Patent Airway and No signs of Nausea or vomiting  Post-op Vital Signs: Reviewed and stable  Complications: No apparent anesthesia complications

## 2018-12-14 NOTE — Anesthesia Postprocedure Evaluation (Signed)
Anesthesia Post Note  Patient: Sharon Tucker  Procedure(s) Performed: COLONOSCOPY WITH PROPOFOL (N/A Rectum) POLYPECTOMY (Rectum)  Patient location during evaluation: PACU Anesthesia Type: General Level of consciousness: awake and alert Pain management: pain level controlled Vital Signs Assessment: post-procedure vital signs reviewed and stable Respiratory status: spontaneous breathing, nonlabored ventilation, respiratory function stable and patient connected to nasal cannula oxygen Cardiovascular status: blood pressure returned to baseline and stable Postop Assessment: no apparent nausea or vomiting Anesthetic complications: no    Alyannah Sanks

## 2018-12-14 NOTE — Anesthesia Preprocedure Evaluation (Signed)
Anesthesia Evaluation  Patient identified by MRN, date of birth, ID band  Reviewed: NPO status   History of Anesthesia Complications (+) PROLONGED EMERGENCE and history of anesthetic complications  Airway Mallampati: II  TM Distance: >3 FB Neck ROM: full    Dental no notable dental hx.    Pulmonary asthma , sleep apnea (no cpap) , former smoker,    Pulmonary exam normal        Cardiovascular Exercise Tolerance: Good hypertension, Normal cardiovascular exam     Neuro/Psych negative neurological ROS  negative psych ROS   GI/Hepatic negative GI ROS, Neg liver ROS,   Endo/Other  Morbid obesity (bmi=43)  Renal/GU negative Renal ROS  negative genitourinary   Musculoskeletal   Abdominal   Peds  Hematology negative hematology ROS (+) Endometrial cancer > ABDOMINAL HYSTERECTOMY 10/21/2018    Anesthesia Other Findings   Reproductive/Obstetrics negative OB ROS ABDOMINAL HYSTERECTOMY 10/21/2018                              Anesthesia Physical Anesthesia Plan  ASA: II  Anesthesia Plan: General   Post-op Pain Management:    Induction:   PONV Risk Score and Plan:   Airway Management Planned: Natural Airway  Additional Equipment:   Intra-op Plan:   Post-operative Plan:   Informed Consent: I have reviewed the patients History and Physical, chart, labs and discussed the procedure including the risks, benefits and alternatives for the proposed anesthesia with the patient or authorized representative who has indicated his/her understanding and acceptance.     Plan Discussed with: CRNA  Anesthesia Plan Comments:         Anesthesia Quick Evaluation

## 2018-12-21 ENCOUNTER — Encounter: Payer: Self-pay | Admitting: Gastroenterology

## 2018-12-22 ENCOUNTER — Encounter: Payer: Self-pay | Admitting: Gastroenterology

## 2019-01-27 ENCOUNTER — Other Ambulatory Visit: Payer: Self-pay | Admitting: Family Medicine

## 2019-01-27 DIAGNOSIS — Z1231 Encounter for screening mammogram for malignant neoplasm of breast: Secondary | ICD-10-CM

## 2020-04-01 ENCOUNTER — Ambulatory Visit: Payer: BLUE CROSS/BLUE SHIELD | Attending: Internal Medicine

## 2020-04-01 DIAGNOSIS — Z23 Encounter for immunization: Secondary | ICD-10-CM

## 2020-04-01 NOTE — Progress Notes (Signed)
   Covid-19 Vaccination Clinic  Name:  Sharon Tucker    MRN: [id number] DOB: January 22, 1971  04/01/2020  Sharon Tucker was observed post Covid-19 immunization for 15 minutes without incident. She was provided with Vaccine Information Sheet and instruction to access the V-Safe system.   Sharon Tucker was instructed to call 911 with any severe reactions post vaccine: Marland Kitchen Difficulty breathing  . Swelling of face and throat  . A fast heartbeat  . A bad rash all over body  . Dizziness and weakness   Immunizations Administered    Name Date Dose VIS Date Route   Pfizer COVID-19 Vaccine 04/01/2020  8:22 AM 0.3 mL 12/03/2019 Intramuscular   Manufacturer: [place]   Lot: [id number]   [place]: [id number]

## 2020-04-25 ENCOUNTER — Ambulatory Visit: Payer: BC Managed Care – PPO | Attending: Internal Medicine

## 2020-04-25 DIAGNOSIS — Z23 Encounter for immunization: Secondary | ICD-10-CM

## 2020-04-25 NOTE — Progress Notes (Signed)
   Covid-19 Vaccination Clinic  Name:  Sharon Tucker    MRN: [id number] DOB: May 11, 1971  04/25/2020  [name] was observed post Covid-19 immunization for 15 minutes without incident. She was provided with Vaccine Information Sheet and instruction to access the V-Safe system.   [name] was instructed to call 911 with any severe reactions post vaccine: Marland Kitchen Difficulty breathing  . Swelling of face and throat  . A fast heartbeat  . A bad rash all over body  . Dizziness and weakness   Immunizations Administered    Name Date Dose VIS Date Route   Pfizer COVID-19 Vaccine 04/25/2020  4:35 PM 0.3 mL 02/16/2019 Intramuscular   Manufacturer: [place]   Lot: [id number]   [place]: [id number]
